# Patient Record
Sex: Female | Born: 1975 | Race: White | Hispanic: No | Marital: Single | Smoking: Current every day smoker
Health system: Southern US, Community
[De-identification: ages and names within clinical notes are randomized; demographics above are authoritative.]

---

## 2003-07-15 ENCOUNTER — Other Ambulatory Visit: Admission: RE | Admit: 2003-07-15 | Discharge: 2003-07-15 | Payer: Self-pay | Admitting: Obstetrics and Gynecology

## 2005-07-22 ENCOUNTER — Ambulatory Visit (HOSPITAL_COMMUNITY): Admission: RE | Admit: 2005-07-22 | Discharge: 2005-07-22 | Payer: Self-pay | Admitting: Obstetrics and Gynecology

## 2009-12-10 ENCOUNTER — Emergency Department: Payer: Self-pay | Admitting: Emergency Medicine

## 2009-12-14 ENCOUNTER — Emergency Department: Payer: Self-pay | Admitting: Emergency Medicine

## 2010-10-02 ENCOUNTER — Emergency Department: Payer: Self-pay | Admitting: Unknown Physician Specialty

## 2010-10-04 ENCOUNTER — Encounter: Payer: Self-pay | Admitting: Obstetrics and Gynecology

## 2010-10-04 ENCOUNTER — Ambulatory Visit: Payer: Self-pay | Admitting: Obstetrics and Gynecology

## 2010-10-04 LAB — CONVERTED CEMR LAB
HCT: 39.6 % (ref 36.0–46.0)
Hemoglobin: 12.9 g/dL (ref 12.0–15.0)
MCHC: 32.6 g/dL (ref 30.0–36.0)
MCV: 86.1 fL (ref 78.0–100.0)
Platelets: 358 10*3/uL (ref 150–400)
RBC: 4.6 M/uL (ref 3.87–5.11)
RDW: 14.1 % (ref 11.5–15.5)
Sed Rate: 27 mm/hr — ABNORMAL HIGH (ref 0–22)
WBC: 7.6 10*3/uL (ref 4.0–10.5)
hCG, Beta Chain, Quant, S: <2 milliintl units/mL

## 2010-10-25 ENCOUNTER — Ambulatory Visit
Admission: RE | Admit: 2010-10-25 | Discharge: 2010-10-25 | Payer: Self-pay | Source: Home / Self Care | Attending: Obstetrics and Gynecology | Admitting: Obstetrics and Gynecology

## 2010-11-08 ENCOUNTER — Ambulatory Visit: Admit: 2010-11-08 | Payer: Self-pay | Admitting: Obstetrics and Gynecology

## 2011-01-11 IMAGING — US US OB < 14 WEEKS
1 series · 16 of 28 positions shown · non-contrast
Comparison: none

REASON FOR EXAM: increase vaginal bleeding/ pain   Flex 7
COMMENTS:

[Series 1: us ob < 14 weeks · 16 of 52 slices shown]
[im 1/52]
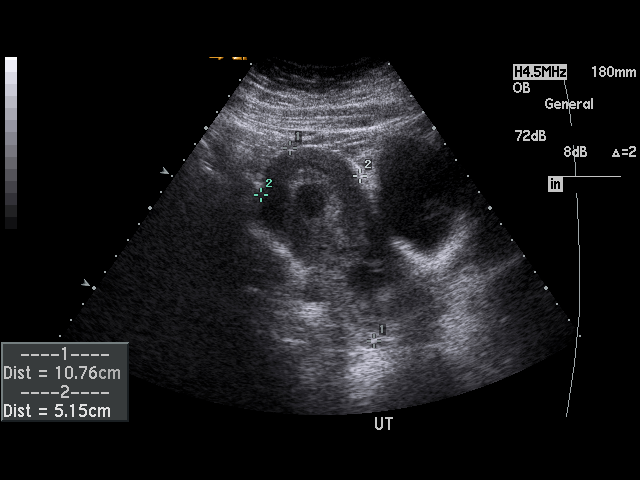
[im 4/52]
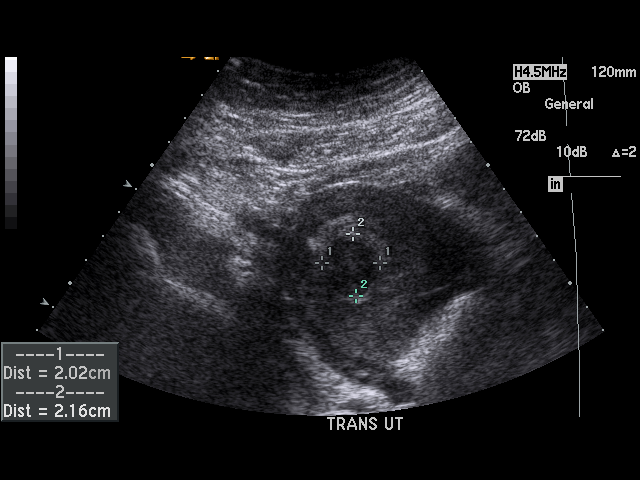
[im 8/52]
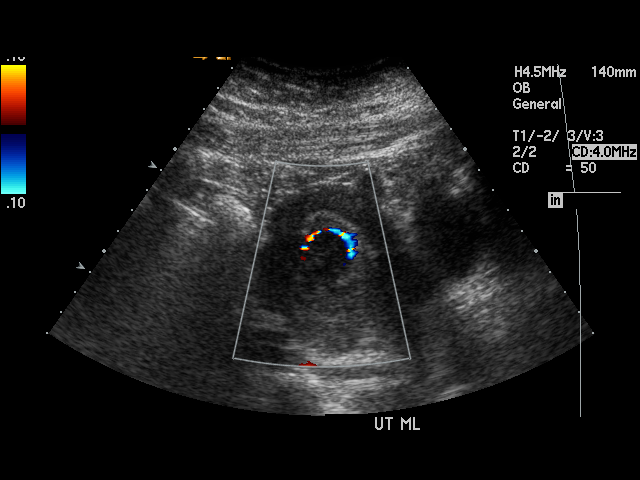
[im 12/52]
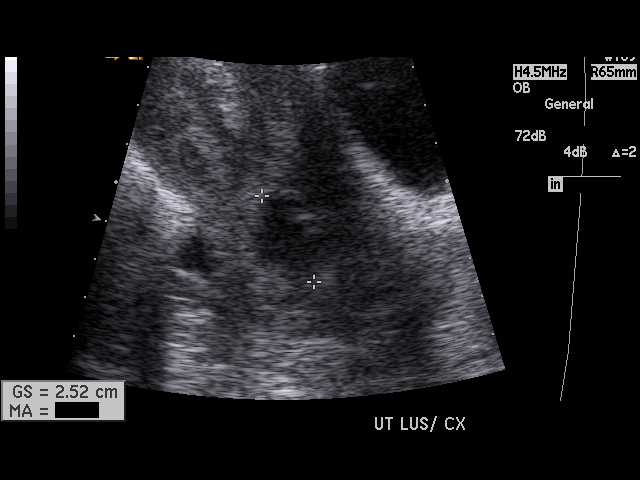
[im 14/52]
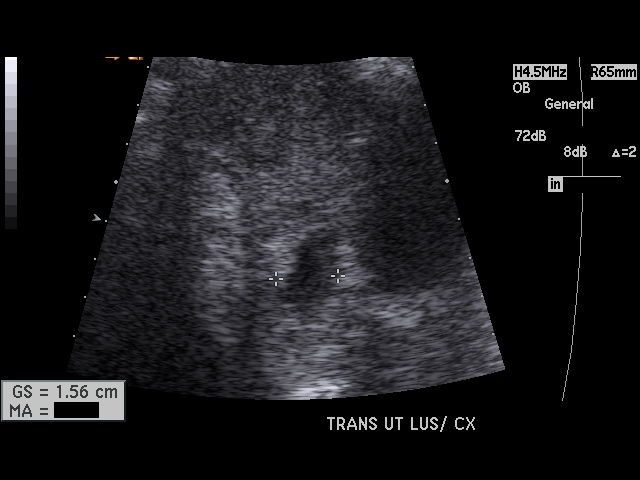
[im 18/52]
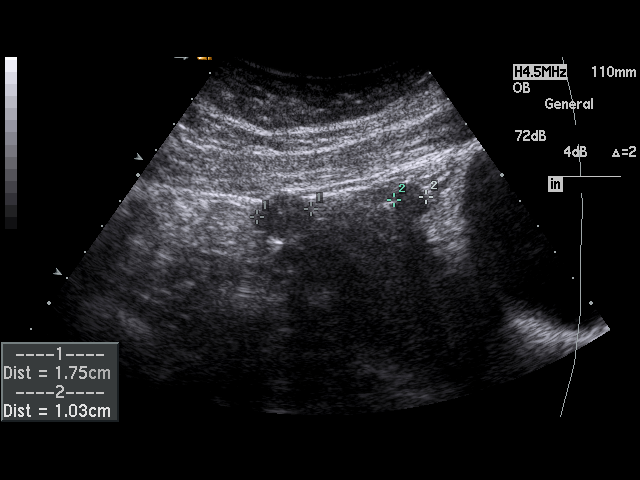
[im 21/52]
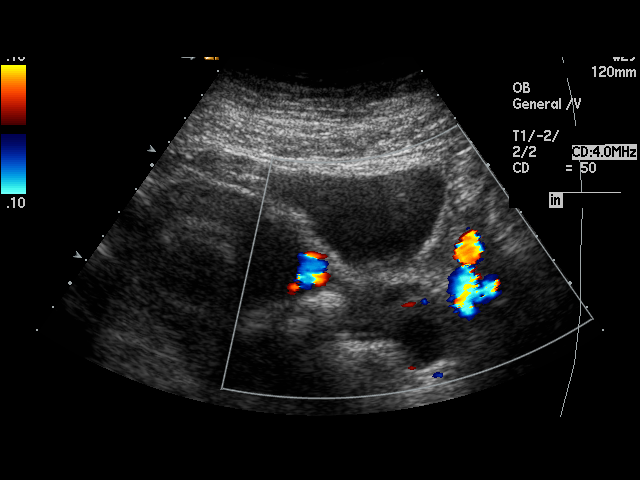
[im 25/52]
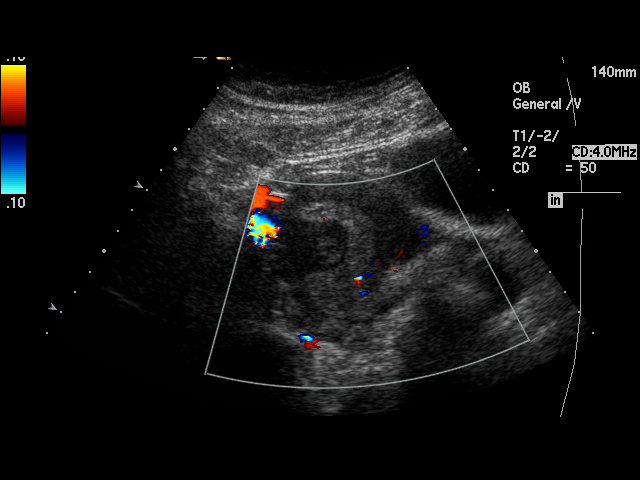
[im 27/52]
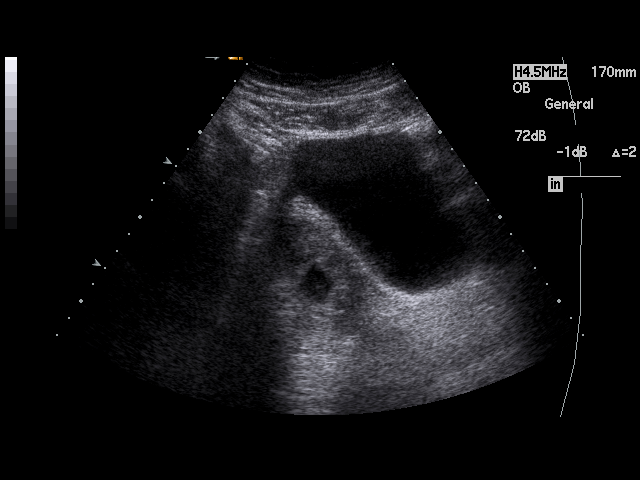
[im 31/52]
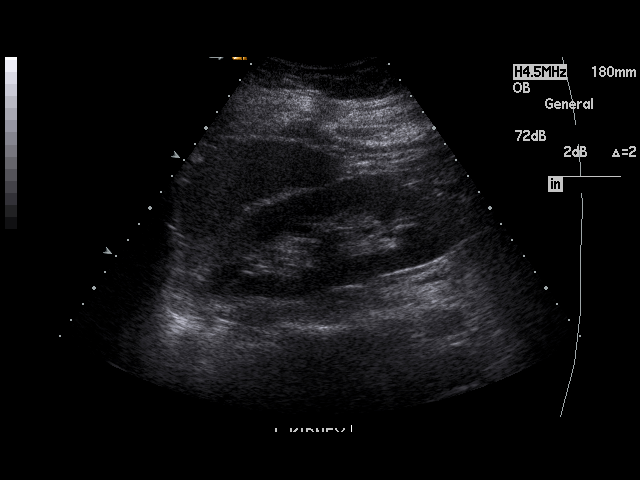
[im 35/52]
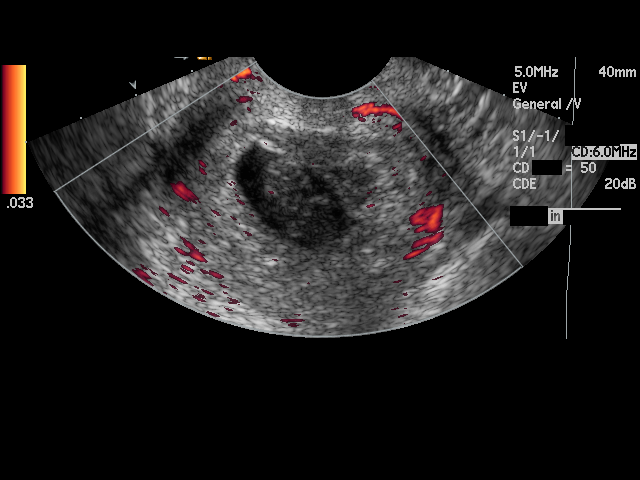
[im 38/52]
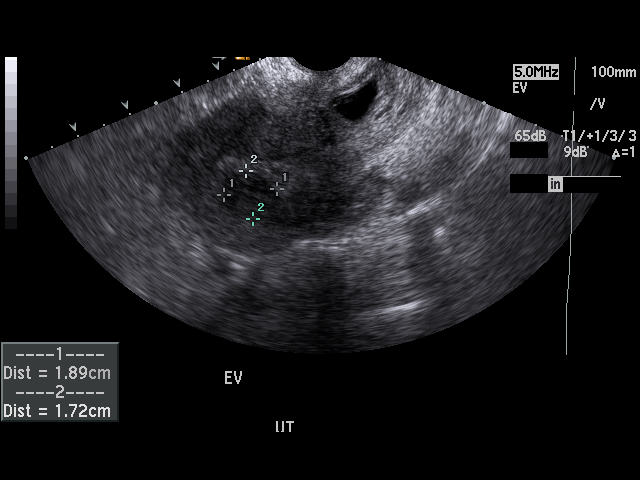
[im 40/52]
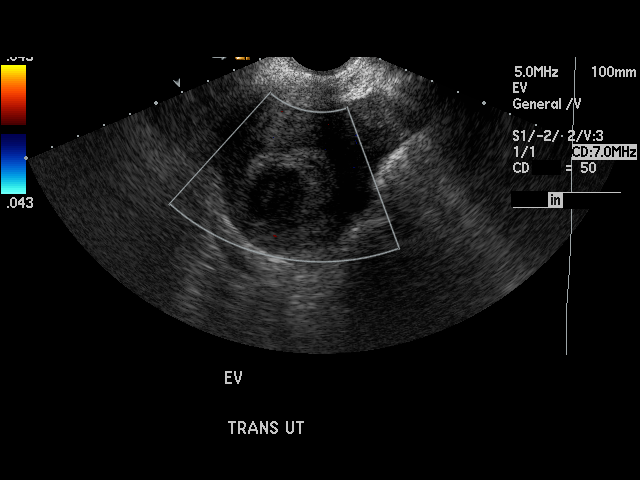
[im 44/52]
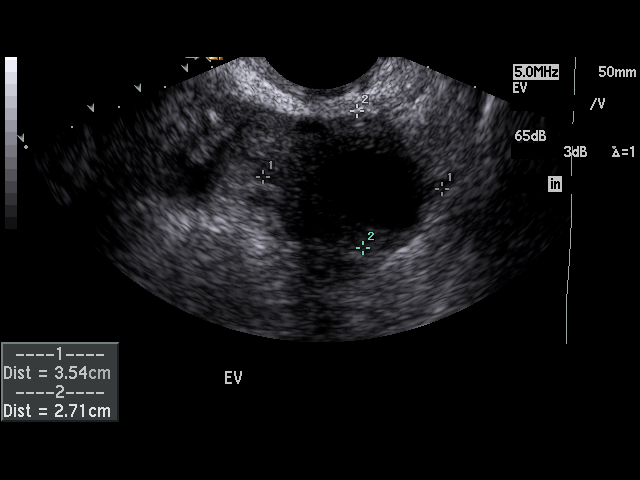
[im 48/52]
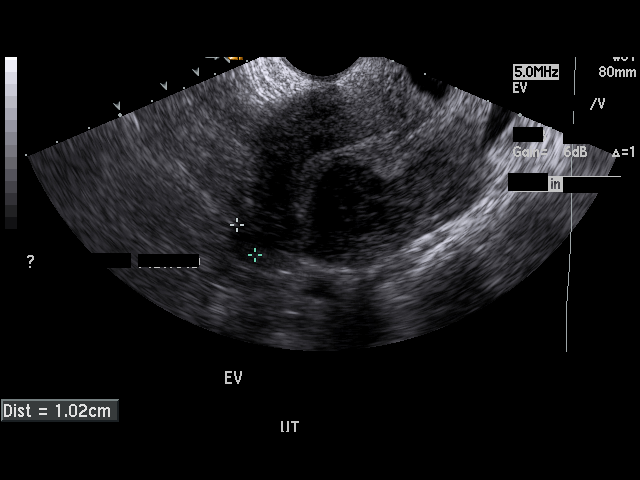
[im 52/52]
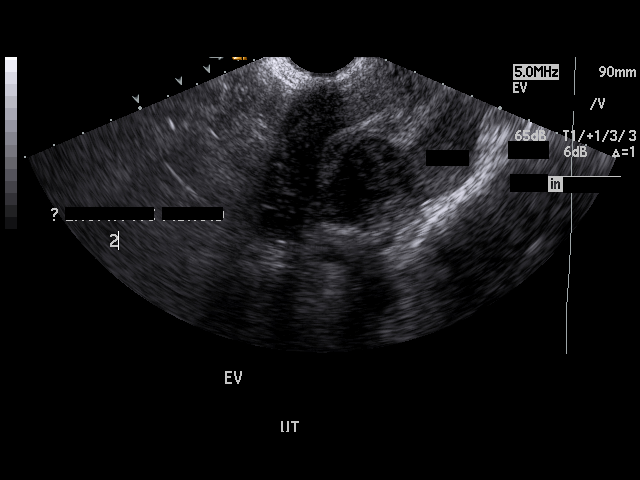

[16 of 28 positions shown; findings below may reference images not displayed]

PROCEDURE:     US  - US OB LESS THAN 14 WEEKS  - December 14, 2009 [DATE]

RESULT:     This study was compared and correlated with previous study dated
12/10/2009.

An intrauterine gestational sac is appreciated. A fetal pole is identified
with a crown-rump length measuring 1.65 cm which corresponds to an estimated
gestational age of 8 weeks-5 day. Mean sac diameter is 2.04 cm corresponding
to an estimated gestational age of 6 weeks-1 days. Previous crown rump
measurement was 1.45 centimeters. This corresponded to an estimated
gestational age of 7 weeks-6 days. An estimated fetal heart rate is
appreciated on the previous study of 167 beats per minute. No appreciable
fetal heart tones are identified on the present study. The gestational sac
projects within the lower uterine segment. Multiple heterogeneous masses are
identified within the uterus indicative of multiple fibroids. The uterus
measures 10.76 x 5.15 x 7.12 cm. Endometrial thickness is 7.9 millimeters.
The right ovary measures 2.27 x 1.0 x 2.19 cm and the left 3.54 x 2.17 x
2.57 cm. A cyst is identified involving the left ovary measuring 2.09 x
x 1.19 cm.
IMPRESSION: 1. Gestational sac appreciated within the lower uterine segment without
evidence of appreciable fetal heart tones.

2. Discordance with the mean sac diameter age, gestational age and the
crown-rump gestational age. These findings are highly concerning for an
inevitable abortion. Correlation with serial beta-hCG is recommended and, if
clinically warranted, further evaluation with repeat ultrasound.

3. Findings also consistent with multiple uterine fibroids.

4. Left ovarian cyst.

Dr. Caleb of the Emergency Department was informed of these findings at the
time of the initial interpretation.

## 2011-03-01 NOTE — Assessment & Plan Note (Signed)
NAME:  April Morse, April Morse NO.:  [id number]   MEDICAL RECORD NO.:  [id number]          PATIENT TYPE:  POB   LOCATION:  [facility] at [facility]         FACILITY:  [facility]   PHYSICIAN:  Argentina Donovan, MD        DATE OF BIRTH:  [birth year]   DATE OF SERVICE:                                  CLINIC NOTE   HISTORY OF PRESENT ILLNESS:  The patient is a 35 year old gravida 1,  para 0-0-1-0 who miscarried 11 months ago, had regular periods in  September and then had some irregularity up until December beginning of  January, she had some clear mucus, a little bit blood tinged.  I had  switched her to Percocet from the ibuprofen, since the patient was  taking that so frequently and have her upper abdominal pain that I was  worried secondary to that.  Since that time, the pain has gone away from  the upper abdomen.  Her lower abdominal tenderness has persisted  however.  She had a slightly elevated ESR.  I had put her on doxycycline  which she has just about finished, although I did not think that she had  PID.  She does have fibroid, small on the ultrasound, one was adjacent  to the endometrium and 2 were subserosal.  The uterus showed a moderate  enlargement.  I think that her discharge may have been ovulatory, so she  should have a period within the next week.  In any case, I am going to  have her come back in a few weeks and if that would did happen , I think  we will put her on birth control pills for short time to see whether  that will control her pain, if not I think laparoscopy would be  indicated.   IMPRESSION:  Dysfunctional uterine bleeding with pelvic pain of unknown  etiology.           ______________________________  Argentina Donovan, MD     PR/MEDQ  D:  10/25/2010  T:  10/26/2010  Job:  [id number]

## 2011-03-01 NOTE — Assessment & Plan Note (Signed)
NAME:  April Morse, April Morse NO.:  [id number]   MEDICAL RECORD NO.:  [id number]          PATIENT TYPE:  POB   LOCATION:  [facility] at [facility]         FACILITY:  [facility]   PHYSICIAN:  Argentina Donovan, MD        DATE OF BIRTH:  1976/05/31   DATE OF SERVICE:  10/04/2010                                  CLINIC NOTE   HISTORY OF PRESENT ILLNESS:  The patient is a 35 year old gravida 1,  para 0-0-1-0 who miscarried 11 months ago, had regular periods up until  September when she had her last normal, although the bleeding had been  heavier since the miscarriage, did use no contraception since that time.  She had her last normal period around the July 06, 2010, and around  July 27, 2010, she had a bleeding event with some discomfort.  This  was followed 2 weeks later by another one where the pain was much worse  and 2 weeks later she had another period this time the pain got terribly  strong she took ibuprofen and a few Percocet she had had left from the  time she had a miscarriage and eventually went into the emergency room.  Once she was there, they got an ultrasound which showed a moderately  enlarged uterus 10 x 4 x 5 x 7 with at least 3 fibroids one adjacent to  the endometrium that was 2.4 x 1.9 x 2.9 to the right in the midline and  2 subserosal fibroids.  No evidence of fluid collection was seen and the  ovaries appeared normal.  The pain has continued.  The bleeding has  stopped yesterday and yet the pain has continued.  She is not sure  whether it is less or because when the pain meds wear off, the pain gets  worse.  This has also been complicated by epigastric pain, but she has  taken significant amount of ibuprofen since the onset of this and I am  hoping that that is what the cause of it is.  We are going to stop the  ibuprofen just keep her on hydrocodone and Percocet and expect that the  pain will go away within a few days.  She has been instructed to come  back  if it is not.   On examination, the abdomen is soft, flat, very tender suprapubically,  and when pressure is applied to the suprapubic area, she notices the  pain down in the vaginal area.  There is no guarding or rebound noted.  On bimanual exam, the uterus is somewhat difficult to outline because of  the motion tenderness.   IMPRESSION:  Dysfunctional uterine bleeding accompanied by pain.  I am  surprised that there is no sign of fluid in the abdomen on ultrasound.  I am going to get a quantitative beta, as I am not sure whether they did  a serum pregnancy test in the emergency room, a CBC, and a sed rate.  I  doubt that the patient has PID.  I think that this is related to the  dysfunctional bleeding.  If it was not for the ultrasound I will be  certain,  she probably had a ruptured ovarian cyst, so I will see her in  followup in approximately 1 week and to reevaluate the condition.  I  have told her I do not think that the fibroids play much of a part in  this, although they could account for the increased bleeding from the  time of her miscarriage.           ______________________________  Argentina Donovan, MD     PR/MEDQ  D:  10/04/2010  T:  10/05/2010  Job:  [id number]

## 2017-10-16 ENCOUNTER — Encounter (INDEPENDENT_AMBULATORY_CARE_PROVIDER_SITE_OTHER): Payer: Self-pay

## 2017-10-16 ENCOUNTER — Ambulatory Visit: Payer: Self-pay | Attending: Oncology | Admitting: *Deleted

## 2017-10-16 VITALS — BP 114/71 | HR 65 | Temp 97.9°F | Ht 72.0 in | Wt 210.0 lb

## 2017-10-16 DIAGNOSIS — N63 Unspecified lump in unspecified breast: Secondary | ICD-10-CM

## 2017-10-16 NOTE — Progress Notes (Signed)
Subjective:     Patient ID: Marcy SalvoAnna Dixon Sharp, female   DOB: 06/01/1976, 41 y.o.   MRN: [id number]  HPI   Review of Systems     Objective:   Physical Exam  Pulmonary/Chest: Right breast exhibits tenderness. Right breast exhibits no inverted nipple, no mass, no nipple discharge and no skin change. Left breast exhibits no inverted nipple, no mass, no nipple discharge, no skin change and no tenderness. Breasts are symmetrical.         Assessment:     41 year [facility] White [facility] intermittent right breast pain for 6 months.  States it feels like it is "pulling" at times.  States she did feel a mass at 9:00 during her menstrual cycle but cannot feel it now.  On clinical breast exam, bilateral breast have firm fibroglandular like tissue, with greater tissue at the right.  Patient complains of right breast pain radiating from lateral chest to 9:00 at the nipple.  There is no dominant mass, nipple discharge, skin changes or lymphadenopathy.  Taught self breast awareness.  Patient has been screened for eligibility.  She does not have any insurance, Medicare or Medicaid.  She also meets financial eligibility.  Hand-out given on the Affordable Care Act.       Plan:     Will go ahead and get bilateral diagnostic mammogram and ultrasound.  Will follow-up per [facility] protocol.

## 2017-10-16 NOTE — Patient Instructions (Signed)
Gave patient hand-out, Women Staying Healthy, Active and Well from [facility], with education on breast health, pap smears, heart and colon health. 

## 2017-10-23 ENCOUNTER — Ambulatory Visit
Admission: RE | Admit: 2017-10-23 | Discharge: 2017-10-23 | Disposition: A | Discharge status: Home / Self Care | Payer: Self-pay | Source: Ambulatory Visit | Attending: Oncology | Admitting: Oncology

## 2017-10-23 ENCOUNTER — Encounter (HOSPITAL_COMMUNITY): Payer: Self-pay

## 2017-10-23 DIAGNOSIS — N63 Unspecified lump in unspecified breast: Secondary | ICD-10-CM

## 2017-10-27 ENCOUNTER — Other Ambulatory Visit: Payer: Self-pay | Admitting: *Deleted

## 2017-10-27 ENCOUNTER — Encounter: Payer: Self-pay | Admitting: *Deleted

## 2017-10-27 DIAGNOSIS — N6489 Other specified disorders of breast: Secondary | ICD-10-CM

## 2017-10-27 NOTE — Progress Notes (Signed)
Letter mailed to inform patient of her next mammogram on 04/23/18 @ 10:20.  [facility] to [place].

## 2018-04-23 ENCOUNTER — Other Ambulatory Visit: Payer: Self-pay

## 2018-11-20 IMAGING — US US BREAST*R* LIMITED INC AXILLA
1 series · 2 of 2 positions shown · non-contrast
Comparison: None

CLINICAL DATA: Patient presents for focal tenderness within the 9
o'clock position right breast.

EXAM:
2D DIGITAL DIAGNOSTIC BILATERAL MAMMOGRAM WITH CAD AND ADJUNCT TOMO
ULTRASOUND RIGHT BREAST

[Series 1: us breast*right* limited inc axilla · 0.08mm/px · 2 of 2 slices shown]
[im 1/2]
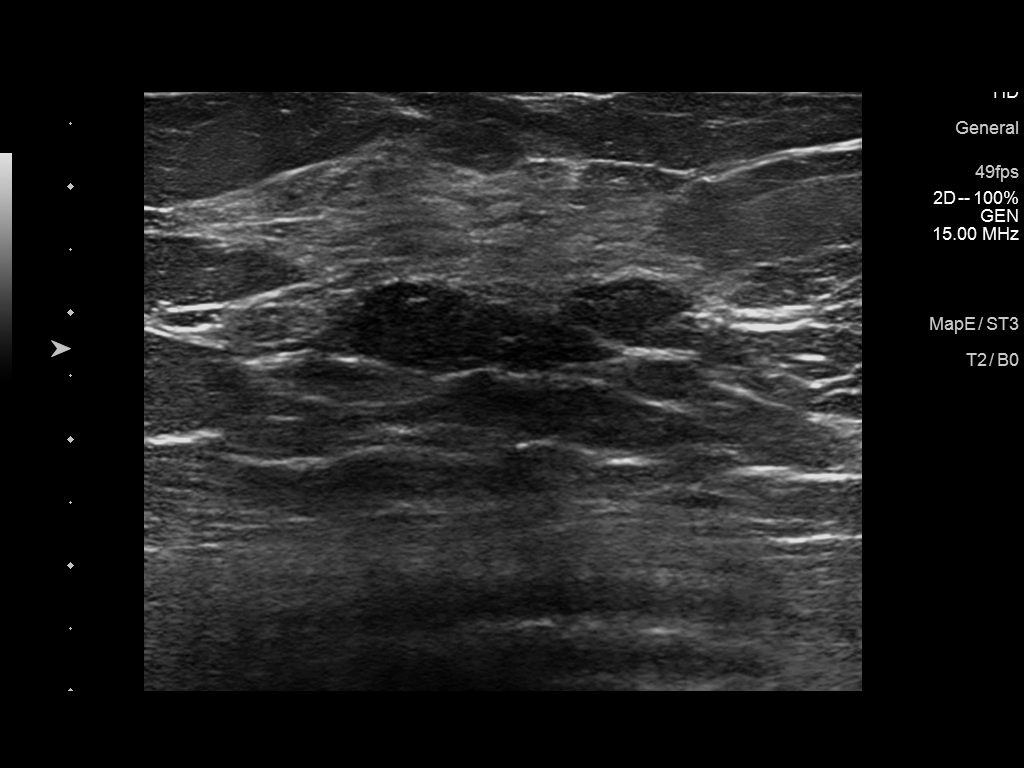
[im 2/2]
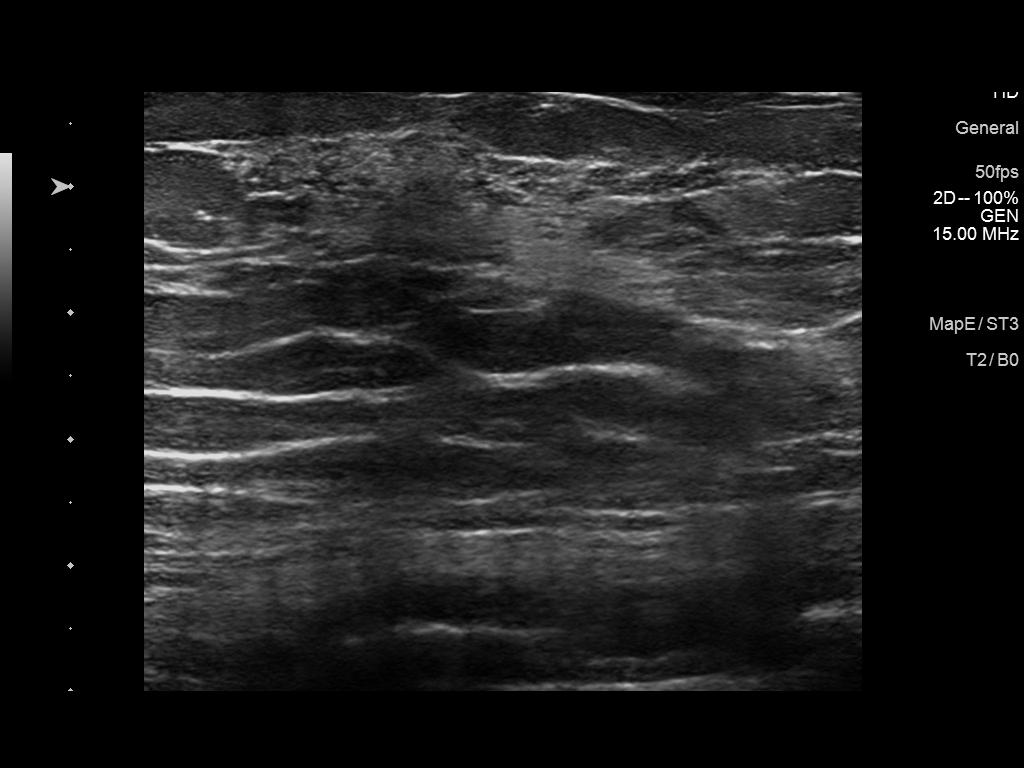

[2 of 2 positions shown; findings below may reference images not displayed]

ACR Breast Density Category b: There are scattered areas of
fibroglandular density.
FINDINGS: Persistent focal asymmetry within the 11-1 o'clock position right
breast middle depth. No additional concerning masses, calcifications
or distortion identified within either breast.

Mammographic images were processed with CAD.

On physical exam, I palpate no discrete mass within the superior or
lateral right breast.

Targeted ultrasound is performed, showing normal dense tissue within
the right breast 12 o'clock position 3 cm from nipple, felt to
correspond with mammographic asymmetry.

No suspicious abnormality 9 o'clock position right breast at the
site of tenderness.
IMPRESSION: 1. Probably benign right breast asymmetry.
2. No suspicious abnormality lateral right breast at the site of
focal tenderness.

RECOMMENDATION:
Right breast diagnostic mammogram and possible ultrasound in 6
months to ensure stability of probably benign right breast
asymmetry.

I have discussed the findings and recommendations with the patient.
Results were also provided in writing at the conclusion of the
visit. If applicable, a reminder letter will be sent to the patient
regarding the next appointment.

BI-RADS CATEGORY  3: Probably benign.

## 2020-06-12 ENCOUNTER — Encounter: Payer: Self-pay | Admitting: Emergency Medicine

## 2020-06-12 ENCOUNTER — Emergency Department
Admission: EM | Admit: 2020-06-12 | Discharge: 2020-06-12 | Disposition: A | Discharge status: Home / Self Care | Payer: Self-pay | Attending: Emergency Medicine | Admitting: Emergency Medicine

## 2020-06-12 ENCOUNTER — Other Ambulatory Visit: Payer: Self-pay

## 2020-06-12 DIAGNOSIS — F1193 Opioid use, unspecified with withdrawal: Secondary | ICD-10-CM

## 2020-06-12 DIAGNOSIS — F17200 Nicotine dependence, unspecified, uncomplicated: Secondary | ICD-10-CM | POA: Insufficient documentation

## 2020-06-12 DIAGNOSIS — F1123 Opioid dependence with withdrawal: Secondary | ICD-10-CM | POA: Insufficient documentation

## 2020-06-12 LAB — COMPREHENSIVE METABOLIC PANEL
ALT: 26 U/L (ref 0–44)
AST: 27 U/L (ref 15–41)
Albumin: 3.9 g/dL (ref 3.5–5.0)
Alkaline Phosphatase: 63 U/L (ref 38–126)
Anion gap: 10 (ref 5–15)
BUN: 15 mg/dL (ref 6–20)
CO2: 20 mmol/L — ABNORMAL LOW (ref 22–32)
Calcium: 8.8 mg/dL — ABNORMAL LOW (ref 8.9–10.3)
Chloride: 109 mmol/L (ref 98–111)
Creatinine, Ser: 0.62 mg/dL (ref 0.44–1.00)
GFR calc Af Amer: >60 mL/min (ref >60)
GFR calc non Af Amer: >60 mL/min (ref >60)
Glucose, Bld: 119 mg/dL — ABNORMAL HIGH (ref 70–99)
Potassium: 3.5 mmol/L (ref 3.5–5.1)
Sodium: 139 mmol/L (ref 135–145)
Total Bilirubin: 0.7 mg/dL (ref 0.3–1.2)
Total Protein: 6.9 g/dL (ref 6.5–8.1)

## 2020-06-12 LAB — CBC
HCT: 36.5 % (ref 36.0–46.0)
Hemoglobin: 12 g/dL (ref 12.0–15.0)
MCH: 26.8 pg (ref 26.0–34.0)
MCHC: 32.9 g/dL (ref 30.0–36.0)
MCV: 81.5 fL (ref 80.0–100.0)
Platelets: 267 10*3/uL (ref 150–400)
RBC: 4.48 MIL/uL (ref 3.87–5.11)
RDW: 14.2 % (ref 11.5–15.5)
WBC: 4.6 10*3/uL (ref 4.0–10.5)
nRBC: 0 % (ref 0.0–0.2)

## 2020-06-12 LAB — URINE DRUG SCREEN, QUALITATIVE (ARMC ONLY)
Amphetamines, Ur Screen: NOT DETECTED
Barbiturates, Ur Screen: NOT DETECTED
Benzodiazepine, Ur Scrn: NOT DETECTED
Cannabinoid 50 Ng, Ur ~~LOC~~: NOT DETECTED
Cocaine Metabolite,Ur ~~LOC~~: POSITIVE — AB
MDMA (Ecstasy)Ur Screen: NOT DETECTED
Methadone Scn, Ur: NOT DETECTED
Opiate, Ur Screen: NOT DETECTED
Phencyclidine (PCP) Ur S: NOT DETECTED
Tricyclic, Ur Screen: NOT DETECTED

## 2020-06-12 LAB — ETHANOL: Alcohol, Ethyl (B): <10 mg/dL (ref <10)

## 2020-06-12 LAB — SALICYLATE LEVEL: Salicylate Lvl: <7 mg/dL — ABNORMAL LOW (ref 7.0–30.0)

## 2020-06-12 LAB — ACETAMINOPHEN LEVEL: Acetaminophen (Tylenol), Serum: <10 ug/mL — ABNORMAL LOW (ref 10–30)

## 2020-06-12 MED ORDER — ONDANSETRON HCL 4 MG/2ML IJ SOLN
4.0000 mg | Freq: Once | INTRAMUSCULAR | Status: AC
  Administered 2020-06-12: 4 mg via INTRAVENOUS
  Filled 2020-06-12: qty 2

## 2020-06-12 MED ORDER — CLONIDINE HCL 0.1 MG PO TABS
0.1000 mg | ORAL_TABLET | Freq: Once | ORAL | Status: AC
  Administered 2020-06-12: 0.1 mg via ORAL
  Filled 2020-06-12: qty 1

## 2020-06-12 MED ORDER — ONDANSETRON 4 MG PO TBDP: 4.0000 mg | ORAL_TABLET | Freq: Three times a day (TID) | ORAL | 0 refills | Status: AC | PRN

## 2020-06-12 MED ORDER — LORAZEPAM 1 MG PO TABS
1.0000 mg | ORAL_TABLET | Freq: Once | ORAL | Status: AC
  Administered 2020-06-12: 1 mg via ORAL
  Filled 2020-06-12: qty 1

## 2020-06-12 MED ORDER — LACTATED RINGERS IV BOLUS
1000.0000 mL | Freq: Once | INTRAVENOUS | Status: AC
  Administered 2020-06-12: 1000 mL via INTRAVENOUS

## 2020-06-12 NOTE — ED Triage Notes (Signed)
Pt presents to ED via [facility] with c/o drug overdose. Pt states took 3 suboxone earlier today that were not her prescribed medications, and 1 trazadone that was not her prescribed medication. Pt states she was trying to go to sleep because she can't sleep. Pt repeatedly screaming while in the lobby that her "whole body is on fire" and "please help me please help me". Pt noted to be unable to sit still while in triage.   Pt states took all of the medications at approx 11am.

## 2020-06-12 NOTE — ED Provider Notes (Signed)
[facility] Emergency Department Provider Note   ____________________________________________   First MD Initiated Contact with Patient 06/12/20 1501     (approximate)  I have reviewed the triage vital signs and the nursing notes.   HISTORY  Chief Complaint Drug Overdose    HPI April Morse is a 44 y.o. female with past medical history of polysubstance abuse who presents to the ED complaining of overdose.  Patient reports that she took 3 Suboxone earlier today that she borrowed from a friend because "I did not want to do heroin."  She states that she had injected heroin as recently as 2 days ago, and was not taking the Suboxone in an effort to harm herself.  She reports that shortly after taking the Suboxone she started to feel like her "whole body is on fire" with nausea but no vomiting.  She reports also taking 1 trazodone to help her sleep but again did not take this with any intent to harm herself.  She denies any recent suicidal or homicidal ideation.  She denies any drug abuse beyond injecting heroin.        History reviewed. No pertinent past medical history.  There are no problems to display for this patient.   History reviewed. No pertinent surgical history.  Prior to Admission medications   Medication Sig Start Date End Date Taking? Authorizing Provider  ondansetron (ZOFRAN ODT) 4 MG disintegrating tablet Take 1 tablet (4 mg total) by mouth every 8 (eight) hours as needed for nausea or vomiting. 06/12/20   Chesley Noon, MD    Allergies Patient has no known allergies.  Family History  Problem Relation Age of Onset  . Breast cancer Neg Hx     Social History Social History   Tobacco Use  . Smoking status: Current Every Day Smoker  . Smokeless tobacco: Never Used  Substance Use Topics  . Alcohol use: Yes  . Drug use: Yes    Review of Systems  Constitutional: No fever/chills.  Positive for malaise. Eyes: No visual  changes. ENT: No sore throat. Cardiovascular: Denies chest pain. Respiratory: Denies shortness of breath. Gastrointestinal: No abdominal pain.  Positive for nausea, no vomiting.  No diarrhea.  No constipation. Genitourinary: Negative for dysuria. Musculoskeletal: Negative for back pain. Skin: Negative for rash. Neurological: Negative for headaches, focal weakness or numbness.  ____________________________________________   PHYSICAL EXAM:  VITAL SIGNS: ED Triage Vitals  Enc Vitals Group     BP 06/12/20 1308 (!) 175/85     Pulse Rate 06/12/20 1308 91     Resp 06/12/20 1308 (!) 22     Temp 06/12/20 1308 99 F (37.2 C)     Temp Source 06/12/20 1308 Oral     SpO2 06/12/20 1308 100 %     Weight 06/12/20 1311 220 lb (99.8 kg)     Height 06/12/20 1311 5\' 10"  (1.778 m)     Head Circumference --      Peak Flow --      Pain Score 06/12/20 1311 10     Pain Loc --      Pain Edu? --      Excl. in GC? --     Constitutional: Alert and oriented. Eyes: Conjunctivae are normal. Head: Atraumatic. Nose: No congestion/rhinnorhea. Mouth/Throat: Mucous membranes are moist. Neck: Normal ROM Cardiovascular: Normal rate, regular rhythm. Grossly normal heart sounds. Respiratory: Normal respiratory effort.  No retractions. Lungs CTAB. Gastrointestinal: Soft and nontender. No distention. Genitourinary: deferred Musculoskeletal: No lower  extremity tenderness nor edema. Neurologic:  Normal speech and language. No gross focal neurologic deficits are appreciated. Skin:  Skin is warm, dry and intact. No rash noted. Psychiatric: Mood and affect are normal. Speech and behavior are normal.  ____________________________________________   LABS (all labs ordered are listed, but only abnormal results are displayed)  Labs Reviewed  COMPREHENSIVE METABOLIC PANEL - Abnormal; Notable for the following components:      Result Value   CO2 20 (*)    Glucose, Bld 119 (*)    Calcium 8.8 (*)    All other  components within normal limits  SALICYLATE LEVEL - Abnormal; Notable for the following components:   Salicylate Lvl <7.0 (*)    All other components within normal limits  ACETAMINOPHEN LEVEL - Abnormal; Notable for the following components:   Acetaminophen (Tylenol), Serum <10 (*)    All other components within normal limits  URINE DRUG SCREEN, QUALITATIVE ([facility] ONLY) - Abnormal; Notable for the following components:   Cocaine Metabolite,Ur [place] POSITIVE (*)    All other components within normal limits  ETHANOL  CBC  POC URINE PREG, ED   ____________________________________________  EKG  ED ECG REPORT I, Chesley Noon, the attending physician, personally viewed and interpreted this ECG.   Date: 06/12/2020  EKG Time: 13:18  Rate: 79  Rhythm: normal sinus rhythm  Axis: Normal  Intervals:none  ST&T Change: None   PROCEDURES  Procedure(s) performed (including Critical Care):  Procedures   ____________________________________________   INITIAL IMPRESSION / ASSESSMENT AND PLAN / ED COURSE       44 year old female with past medical history of polysubstance abuse presents to the ED for feeling like her "whole body is on fire" with nausea after taking 3 Suboxone.  She states she took the Suboxone to help avoid using heroin and denies any intent to harm herself, specifically denies any suicidal or homicidal ideation.  She does appear to have precipitated opiate withdrawal and we will treat symptomatically with IV fluids, Zofran, and clonidine.  No evidence of opiate toxicity at this time and lab work is reassuring.  We will reassess for symptomatic relief.  Patient feeling slightly better following IV fluids, Zofran, clonidine, and small dose of Ativan.  She is appropriate for discharge home and was given information for [facility] for substance abuse treatment.  She was counseled to return to the ED for new or worsening symptoms, patient agrees with plan.       ____________________________________________   FINAL CLINICAL IMPRESSION(S) / ED DIAGNOSES  Final diagnoses:  Opiate withdrawal [facility])     ED Discharge Orders         Ordered    ondansetron (ZOFRAN ODT) 4 MG disintegrating tablet  Every 8 hours PRN        06/12/20 1955           Note:  This document was prepared using Dragon voice recognition software and may include unintentional dictation errors.   Chesley Noon, MD 06/12/20 [phone]

## 2020-06-12 NOTE — ED Notes (Addendum)
First nurse note: Pt here via EMS after taking 3 strips of Suboxone, wouldn't let EMS take vitals, however, yelling out and crying "someone help me," states her body is on fire. Nurse with pt to attempt to calm her down. Unsuccessful at this time.

## 2020-06-12 NOTE — ED Notes (Signed)
VOL, no orders
